# Patient Record
Sex: Male | Born: 1980 | Race: Black or African American | Hispanic: No | Marital: Married | Smoking: Current every day smoker
Health system: Southern US, Community
[De-identification: ages and names within clinical notes are randomized; demographics above are authoritative.]

---

## 2014-04-06 ENCOUNTER — Emergency Department (HOSPITAL_COMMUNITY): Payer: Self-pay

## 2014-04-06 ENCOUNTER — Encounter (HOSPITAL_COMMUNITY): Payer: Self-pay | Admitting: Emergency Medicine

## 2014-04-06 ENCOUNTER — Emergency Department (HOSPITAL_COMMUNITY)
Admission: EM | Admit: 2014-04-06 | Discharge: 2014-04-06 | Disposition: A | Discharge status: Home / Self Care | Payer: Self-pay | Attending: Emergency Medicine | Admitting: Emergency Medicine

## 2014-04-06 DIAGNOSIS — J40 Bronchitis, not specified as acute or chronic: Secondary | ICD-10-CM | POA: Insufficient documentation

## 2014-04-06 DIAGNOSIS — F17200 Nicotine dependence, unspecified, uncomplicated: Secondary | ICD-10-CM | POA: Insufficient documentation

## 2014-04-06 DIAGNOSIS — R079 Chest pain, unspecified: Secondary | ICD-10-CM | POA: Insufficient documentation

## 2014-04-06 LAB — CBC
HEMATOCRIT: 38.9 % — AB (ref 39.0–52.0)
Hemoglobin: 13.8 g/dL (ref 13.0–17.0)
MCH: 29.4 pg (ref 26.0–34.0)
MCHC: 35.5 g/dL (ref 30.0–36.0)
MCV: 82.8 fL (ref 78.0–100.0)
PLATELETS: 271 10*3/uL (ref 150–400)
RBC: 4.7 MIL/uL (ref 4.22–5.81)
RDW: 12 % (ref 11.5–15.5)
WBC: 12 10*3/uL — AB (ref 4.0–10.5)

## 2014-04-06 LAB — PRO B NATRIURETIC PEPTIDE: PRO B NATRI PEPTIDE: 9.7 pg/mL (ref 0–125)

## 2014-04-06 LAB — BASIC METABOLIC PANEL
BUN: 14 mg/dL (ref 6–23)
CHLORIDE: 103 meq/L (ref 96–112)
CO2: 25 meq/L (ref 19–32)
Calcium: 9.6 mg/dL (ref 8.4–10.5)
Creatinine, Ser: 1.27 mg/dL (ref 0.50–1.35)
GFR calc non Af Amer: 75 mL/min — ABNORMAL LOW (ref >90)
GFR, EST AFRICAN AMERICAN: 86 mL/min — AB (ref >90)
Glucose, Bld: 81 mg/dL (ref 70–99)
Potassium: 4 mEq/L (ref 3.7–5.3)
Sodium: 142 mEq/L (ref 137–147)

## 2014-04-06 LAB — I-STAT TROPONIN, ED: Troponin i, poc: 0.02 ng/mL (ref 0.00–0.08)

## 2014-04-06 MED ORDER — ALBUTEROL SULFATE (2.5 MG/3ML) 0.083% IN NEBU
5.0000 mg | INHALATION_SOLUTION | Freq: Once | RESPIRATORY_TRACT | Status: AC
  Administered 2014-04-06: 5 mg via RESPIRATORY_TRACT
  Filled 2014-04-06: qty 6

## 2014-04-06 MED ORDER — IPRATROPIUM BROMIDE 0.02 % IN SOLN
0.5000 mg | Freq: Once | RESPIRATORY_TRACT | Status: AC
  Administered 2014-04-06: 0.5 mg via RESPIRATORY_TRACT
  Filled 2014-04-06: qty 2.5

## 2014-04-06 MED ORDER — HYDROCOD POLST-CHLORPHEN POLST 10-8 MG/5ML PO LQCR
5.0000 mL | Freq: Once | ORAL | Status: AC
  Administered 2014-04-06: 5 mL via ORAL
  Filled 2014-04-06: qty 5

## 2014-04-06 MED ORDER — HYDROCOD POLST-CHLORPHEN POLST 10-8 MG/5ML PO LQCR: 5.0000 mL | Freq: Two times a day (BID) | ORAL | Status: AC | PRN

## 2014-04-06 MED ORDER — IBUPROFEN 200 MG PO TABS
400.0000 mg | ORAL_TABLET | Freq: Once | ORAL | Status: AC
  Administered 2014-04-06: 400 mg via ORAL
  Filled 2014-04-06: qty 2

## 2014-04-06 NOTE — ED Notes (Addendum)
Pt ambulatory with steady gait to void in BR.  Denies further needs/complaints at this time.  Pt reports feeling "a little" better after updraft.

## 2014-04-06 NOTE — ED Provider Notes (Signed)
CSN: [id number]     Arrival date & time 04/06/14  1641 History   First MD Initiated Contact with Patient 04/06/14 1652     Chief Complaint  Patient presents with  . Chest Pain  . Cough     (Consider location/radiation/quality/duration/timing/severity/associated sxs/prior Treatment) HPI Comments: Patient is a 33 year old otherwise healthy male who presents today with chest tightness and cough. He states his symptoms began this morning. They initially responded to an antihistamine and alka seltzer around 1130 this morning, but then later worsened. His cough is productive with mucus. He is not having any hemoptysis. He denies any lung disease or wheezing. He has had bronchitis in the past and states this feels similar to that. He does smoke approximately half a pack a day which he has been doing for 10 years. He denies prior DVT or PE, any recent travel, leg swelling, cancer. No family history of early heart disease.   Patient is a 33 y.o. male presenting with chest pain and cough. The history is provided by the patient. No language interpreter was used.  Chest Pain Associated symptoms: cough   Associated symptoms: no fever, no nausea, no palpitations and not vomiting   Cough Associated symptoms: chest pain   Associated symptoms: no chills and no fever     History reviewed. No pertinent past medical history. History reviewed. No pertinent past surgical history. No family history on file. History  Substance Use Topics  . Smoking status: Current Every Day Smoker    Types: Cigarettes  . Smokeless tobacco: Not on file  . Alcohol Use: No    Review of Systems  Constitutional: Negative for fever and chills.  Respiratory: Positive for cough and chest tightness.   Cardiovascular: Positive for chest pain. Negative for palpitations and leg swelling.  Gastrointestinal: Negative for nausea and vomiting.  All other systems reviewed and are negative.     Allergies  Review of patient's  allergies indicates no known allergies.  Home Medications   Prior to Admission medications   Not on File   BP 145/77  Pulse 80  Temp(Src) 98.5 F (36.9 C) (Oral)  Resp 18  SpO2 99% Physical Exam  Nursing note and vitals reviewed. Constitutional: He is oriented to person, place, and time. He appears well-developed and well-nourished. No distress.  HENT:  Head: Normocephalic and atraumatic.  Right Ear: External ear normal.  Left Ear: External ear normal.  Nose: Nose normal.  Eyes: Conjunctivae are normal.  Neck: Normal range of motion. No tracheal deviation present.  Cardiovascular: Normal rate, regular rhythm and normal heart sounds.   Pulmonary/Chest: Effort normal and breath sounds normal. No stridor.  Abdominal: Soft. He exhibits no distension. There is no tenderness.  Musculoskeletal: Normal range of motion.  Neurological: He is alert and oriented to person, place, and time.  Skin: Skin is warm and dry. He is not diaphoretic.  Psychiatric: He has a normal mood and affect. His behavior is normal.    ED Course  Procedures (including critical care time) Labs Review Labs Reviewed  CBC - Abnormal; Notable for the following:    WBC 12.0 (*)    HCT 38.9 (*)    All other components within normal limits  BASIC METABOLIC PANEL - Abnormal; Notable for the following:    GFR calc non Af Amer 75 (*)    GFR calc Af Amer 86 (*)    All other components within normal limits  PRO B NATRIURETIC PEPTIDE  I-STAT TROPOININ, ED  Imaging Review Dg Chest 2 View  04/06/2014   CLINICAL DATA:  Chest pain and shortness of breath.  EXAM: CHEST  2 VIEW  COMPARISON:  None.  FINDINGS: The cardiac silhouette, mediastinal and hilar contours are normal. The lungs are clear. No pleural effusion. The bony thorax is intact.  IMPRESSION: Normal chest x-ray.   Electronically Signed   By: Loralie ChampagneMark  Gallerani M.D.   On: 04/06/2014 17:34     EKG Interpretation None      MDM   Final diagnoses:   Bronchitis   Pt CXR negative for acute infiltrate. Patients symptoms are consistent with URI, likely viral etiology. Discussed that antibiotics are not indicated for viral infections. Pt will be discharged with symptomatic treatment. Patient is both Well's and PERC negative. A d dimer is not indicated.  Verbalizes understanding and is agreeable with plan. Pt is hemodynamically stable & in NAD prior to dc. Discussed case with Dr. Fayrene FearingJames who agrees with plan. Patient / Family / Caregiver informed of clinical course, understand medical decision-making process, and agree with plan.     Mora BellmanHannah S Merrell, PA-C 04/07/14 1526

## 2014-04-06 NOTE — Discharge Instructions (Signed)
Bronchitis Bronchitis is inflammation of the airways that extend from the windpipe into the lungs (bronchi). The inflammation often causes mucus to develop, which leads to a cough. If the inflammation becomes severe, it may cause shortness of breath. CAUSES  Bronchitis may be caused by:   Viral infections.   Bacteria.   Cigarette smoke.   Allergens, pollutants, and other irritants.  SIGNS AND SYMPTOMS  The most common symptom of bronchitis is a frequent cough that produces mucus. Other symptoms include:  Fever.   Body aches.   Chest congestion.   Chills.   Shortness of breath.   Sore throat.  DIAGNOSIS  Bronchitis is usually diagnosed through a medical history and physical exam. Tests, such as chest X-rays, are sometimes done to rule out other conditions.  TREATMENT  You may need to avoid contact with whatever caused the problem (smoking, for example). Medicines are sometimes needed. These may include:  Antibiotics. These may be prescribed if the condition is caused by bacteria.  Cough suppressants. These may be prescribed for relief of cough symptoms.   Inhaled medicines. These may be prescribed to help open your airways and make it easier for you to breathe.   Steroid medicines. These may be prescribed for those with recurrent (chronic) bronchitis. HOME CARE INSTRUCTIONS  Get plenty of rest.   Drink enough fluids to keep your urine clear or pale yellow (unless you have a medical condition that requires fluid restriction). Increasing fluids may help thin your secretions and will prevent dehydration.   Only take over-the-counter or prescription medicines as directed by your health care provider.  Only take antibiotics as directed. Make sure you finish them even if you start to feel better.  Avoid secondhand smoke, irritating chemicals, and strong fumes. These will make bronchitis worse. If you are a smoker, quit smoking. Consider using nicotine gum or  skin patches to help control withdrawal symptoms. Quitting smoking will help your lungs heal faster.   Put a cool-mist humidifier in your bedroom at night to moisten the air. This may help loosen mucus. Change the water in the humidifier daily. You can also run the hot water in your shower and sit in the bathroom with the door closed for 5 10 minutes.   Follow up with your health care provider as directed.   Wash your hands frequently to avoid catching bronchitis again or spreading an infection to others.  SEEK MEDICAL CARE IF: Your symptoms do not improve after 1 week of treatment.  SEEK IMMEDIATE MEDICAL CARE IF:  Your fever increases.  You have chills.   You have chest pain.   You have worsening shortness of breath.   You have bloody sputum.  You faint.  You have lightheadedness.  You have a severe headache.   You vomit repeatedly. MAKE SURE YOU:   Understand these instructions.  Will watch your condition.  Will get help right away if you are not doing well or get worse. Document Released: 10/08/2005 Document Revised: 07/29/2013 Document Reviewed: 06/02/2013 [facility] Patient Information 2014 [place], [place].   Emergency Department Resource Guide 1) Find a Doctor and Pay Out of Pocket Although you won't have to find out who is covered by your insurance plan, it is a good idea to ask around and get recommendations. You will then need to call the office and see if the doctor you have chosen will accept you as a new patient and what types of options they offer for patients who are self-pay. Some doctors  offer discounts or will set up payment plans for their patients who do not have insurance, but you will need to ask so you aren't surprised when you get to your appointment.  2) Contact Your Local Health Department Not all health departments have doctors that can see patients for sick visits, but many do, so it is worth a call to see if yours does. If you don't  know where your local health department is, you can check in your phone book. The CDC also has a tool to help you locate your state's health department, and many state websites also have listings of all of their local health departments.  3) Find a Walk-in Clinic If your illness is not likely to be very severe or complicated, you may want to try a walk in clinic. These are popping up all over the country in pharmacies, drugstores, and shopping centers. They're usually staffed by nurse practitioners or physician assistants that have been trained to treat common illnesses and complaints. They're usually fairly quick and inexpensive. However, if you have serious medical issues or chronic medical problems, these are probably not your best option.  No Primary Care Doctor: - Call Health Connect at  [phone] - they can help you locate a primary care doctor that  accepts your insurance, provides certain services, etc. - Physician Referral Service- [phone]  Chronic Pain Problems: Organization         Address  Phone   Notes  [facility]  [phone] Patients need to be referred by their primary care doctor.   Medication Assistance: Organization         Address  Phone   Notes  [facility] Medication [facility] [address] [place], [place] [address] [phone]) [phone] --Must be a resident of [place] [facility] -- Must have NO insurance coverage whatsoever (no Medicaid/ Medicare, etc.) -- The pt. MUST have a primary care doctor that directs their care regularly and follows them in the community   MedAssist  [phone]   [employer]  [phone]) [phone]    Agencies that provide inexpensive medical care: Organization         Address  Phone   Notes  [facility]  [phone]   [facility]    [phone]) [phone]   [facility] [address] [place], [place] [address] [phone]) [phone]   [facility]  [place] [address], [place] [phone]   [facility]anned Parenthood    [phone](336) [phone]   [facility]    [phone]   [facility]  [address], [place] Phone:  [phone], Fax:  [phone] Hours of Operation:  9 am - 6 pm, M-F.  Also accepts Medicaid/Medicare and self-pay.  [facility]  [address], [place] Phone: [phone], Fax: [phone]. Hours of Operation:  8:30 am - 5:30 pm, M-F.  Also accepts Medicaid and self-pay.  [facility] [place] [address], [place] Phone: [phone](336) [phone]   [facility] [address], [place], [place] [phone], Ext. [phone] Mondays & Thursdays: 7-9 AM.  First 15 patients are seen on a first come, first serve basis.    Medicaid-accepting [facility] Providers:  Organization         Address  Phone   Notes  [facility] [address], [address], [place] [phone]) [phone] Also accepts self-pay patients.  [facility] [address], [place]  [phone]   [facility] [address], [place] [phone]   [facility] [address] [place] [address], [place] [phone]) [phone]   Renaye RakersVeita Bland [address], [place]   [phone] Only accepts [place] Access [place]icaid patients after they have their name applied to their card.   Self-Pay (no insurance) in [facility] [place]:  Organization         Address  Phone   Notes  Sickle Cell Patients, [facility] [address], [place] [phone]) [phone]   [facility] [address], [place] [phone]   [facility] [place]  [address], [place] [phone]   [facility]/Dr. Osei-Bonsu  [address] [place] [address], [place] or [phone], [place] [phone] Phone  number for both [place] and [place] locations is the same.  [facility] and [facility] [address], [place] [phone]   [facility] [place] [address] [place] [address], [place] or [address] [phone](336) [phone]   [facility] [address], [place] [phone], phone; [phone]) [phone], fax Sees patients 1st and 3rd Saturday of every month.  Must not qualify for public or private insurance (i.e. Medicaid, Medicare, [place] Health Choice, Veterans' Benefits)  Household income should be no more than 200% of the poverty level The clinic cannot treat you if you are pregnant or think you are pregnant  Sexually transmitted diseases are not treated at the clinic.    Dental Care: Organization         Address  Phone  Notes  [facility] Department of [facility] [address], [place] [phone] Accepts children up to age 31 who are enrolled in [place]icaid or [place] Health Choice; pregnant women with a Medicaid card; and children who have applied for Medicaid or [place] Health Choice, but were declined, whose parents can pay a reduced fee at time of service.  [facility] Department of [facility] [place]  [address], [place] [phone] Accepts children up to age 31 who are enrolled in [place]icaid or [place] Health Choice; pregnant women with a Medicaid card; and children who have applied for Medicaid or [place] Health Choice, but were declined, whose parents can pay a reduced fee at time of service.  [facility]  [address], [place] [phone] Patients are seen by appointment only. Walk-ins are not accepted. [facility] will see patients 31 years of age and older. Monday - Tuesday (8am-5pm) Most Wednesdays (8:30-5pm) $30 per visit, cash only  [facility]  [address], [facility] [phone] Patients are seen by appointment only.  Walk-ins are not accepted. [facility] will see patients 31 years of age and older. One Wednesday Evening (Monthly: Volunteer Based).  $30 per visit, cash only  [employer] [facility]  [phone] for adults; Children under age 374, call Graduate Pediatric Dentistry at [phone]. Children aged 354-14, please call [phone] to request a pediatric application.  Dental services are provided in all areas of dental care including fillings, crowns and bridges, complete and partial dentures, implants, gum treatment, root canals, and extractions. Preventive care  is also provided. Treatment is provided to both adults and children. Patients are selected via a lottery and there is often a waiting list.   [facility] [address], [place]  [phone]) [phone] [email or web address]   [facility] [address], [place], [place] [phone], Ext. [phone] Second and Fourth Thursday of each month, opens at 6:30 AM; Clinic ends at 9 AM.  Patients are seen on a first-come first-served basis, and a limited number are seen during each clinic.   [facility]  [address], [place], [place] [phone]   Eligibility Requirements You must have lived in [place], [place], or [place] counties for at least the last three months.   You cannot be eligible for state or federal sponsored National Cityhealthcare insurance, including CIGNAVeterans Administration, [place]icaid, or [employer] EntertainmentMedicare.   You generally cannot be eligible for healthcare insurance through your employer.    How to apply: Eligibility screenings are held every Tuesday and Wednesday afternoon from 1:00 pm until 4:00 pm. You do not need an appointment for the interview!  [facility] [address], [place], [place] [phone]   [facility]  [phone]   [facility]  [phone]   [facility]  [phone]    Behavioral Health  Resources in the Community: Intensive Outpatient Programs Organization         Address  Phone  Notes  [facility] [address], [place], [place] [phone]   [facility] [address], [place], [place] [phone]   [facility] [address], [place], [place]  [phone]   [facility] [address],  [place], [place] [phone] or [phone]   Substance Abuse Resources Organization         Address  Phone  Notes  [facility] Drug Services  [phone]   [facility]  [phone]   [facility]  [phone]   [facility]  [phone]   Residential & Outpatient Substance Abuse Program  [phone]   Psychological Services Organization         Address  Phone  Notes  [facility]  [phone]- [phone]   [facility]  [phone]   [facility] [address], [place] [phone] or [phone]    Mobile Crisis Teams Organization         Address  Phone  Notes  [facility], Mobile Crisis Care Unit  [phone]   [facility]  [address] [place], [place] [phone]   Doristine LocksSharon DeEsch [address] [place] [phone]    Self-Help/Support Groups Organization         Address  Phone             Notes  [facility]ental Health Assoc. of [place] - variety of support groups  336- [phone] Call for more information  Narcotics Anonymous (NA), [facility]aring [facility] [address], [place]  2 meetings at this location   Statisticianesidential Treatment Programs Organization         Address  Phone  Notes  [facility] [address],    [place]  [phone]   [facility]  [address], [place] [phone], [place], [place] [phone]   [facility] [address], [place] [phone] Admissions: 8am-3pm M-F  Incentives Substance Abuse  Treatment Center [address]  [place], [place]   [facility] [address] Jadene Pierini [place], [place]   The Gaines.,  [place], [place]   [facility] - Intensive Outpatient [address], [place], [place]   [facility] [address]  [place], [place] [phone] or [phone]   [facility] (RTS) [address] [place], [place] Accepts Medicaid  [facility] [place] [address],  [place] [phone] Substance Abuse/Addiction Treatment   [facility] Organization         Address  Phone  Notes  [facility]  [phone]   Domenic Schwab, PhD [address] Arlis Porta [place], [place]   [phone] or [phone]   [facility] [place], [place] [phone]   [facility] [address], [place], [place] [phone] Insurance/Medicaid/sponsorship through [facility] and Families [address] [place] [address]                                    [place], [place] [phone] [place] [address], [place] [phone]    Dr. Adele Schilder  [phone]   [facility] [place] Dept. 1) [address], [place] 2) [place] 3)  [place] [address], [place] [phone]  [phone]   [place] [phone] or [phone] (After Hours)

## 2014-04-06 NOTE — ED Notes (Signed)
Pt states that he woke up coughing this morning around 530 then when back to sleep. Pt states that when he got up at 830 he had chest tightness.  Pt states he took Claritin and alka seltzer and states the pain felt better. Pt states that pain is tight when he breathes/coughs.

## 2014-04-06 NOTE — ED Notes (Signed)
Initial Contact - [facility]/o cough and central CP.  Reports pain worse with cough and breathing.  Pt reports awakening with these symp this AM.  Pt with faint exp wheeze noted.  Denies SOB.  Speaking full/clear sentences, rr even/un-lab.  Skin PWD.  MAEI, ambulatory with steady gait.  Changed to hospital gown, placed to cardiac/02 monitor.  NAD.

## 2014-04-10 NOTE — ED Provider Notes (Signed)
Medical screening examination/treatment/procedure(s) were performed by non-physician practitioner and as supervising physician I was immediately available for consultation/collaboration.   EKG Interpretation   Date/Time:  Tuesday April 06 2014 16:46:39 EDT Ventricular Rate:  80 PR Interval:  155 QRS Duration: 98 QT Interval:  347 QTC Calculation: 400 R Axis:   68 Text Interpretation:  Sinus rhythm RSR' in V1 or V2, probably normal  variant ED PHYSICIAN INTERPRETATION AVAILABLE IN [facility] Confirmed  by TEST, Record ([id number]) on 04/08/2014 12:32:11 PM        Rolland PorterMark Destani Wamser, MD 04/10/14 0730

## 2014-11-11 ENCOUNTER — Emergency Department (HOSPITAL_COMMUNITY)
Admission: EM | Admit: 2014-11-11 | Discharge: 2014-11-12 | Disposition: A | Discharge status: Home / Self Care | Payer: Self-pay | Attending: Emergency Medicine | Admitting: Emergency Medicine

## 2014-11-11 ENCOUNTER — Encounter (HOSPITAL_COMMUNITY): Payer: Self-pay

## 2014-11-11 DIAGNOSIS — W540XXA Bitten by dog, initial encounter: Secondary | ICD-10-CM | POA: Insufficient documentation

## 2014-11-11 DIAGNOSIS — S51812A Laceration without foreign body of left forearm, initial encounter: Secondary | ICD-10-CM | POA: Insufficient documentation

## 2014-11-11 DIAGNOSIS — S51832A Puncture wound without foreign body of left forearm, initial encounter: Secondary | ICD-10-CM | POA: Insufficient documentation

## 2014-11-11 DIAGNOSIS — Y9389 Activity, other specified: Secondary | ICD-10-CM | POA: Insufficient documentation

## 2014-11-11 DIAGNOSIS — Z720 Tobacco use: Secondary | ICD-10-CM | POA: Insufficient documentation

## 2014-11-11 DIAGNOSIS — Y9289 Other specified places as the place of occurrence of the external cause: Secondary | ICD-10-CM | POA: Insufficient documentation

## 2014-11-11 DIAGNOSIS — Z79899 Other long term (current) drug therapy: Secondary | ICD-10-CM | POA: Insufficient documentation

## 2014-11-11 DIAGNOSIS — Z792 Long term (current) use of antibiotics: Secondary | ICD-10-CM | POA: Insufficient documentation

## 2014-11-11 DIAGNOSIS — Z23 Encounter for immunization: Secondary | ICD-10-CM | POA: Insufficient documentation

## 2014-11-11 DIAGNOSIS — Y998 Other external cause status: Secondary | ICD-10-CM | POA: Insufficient documentation

## 2014-11-11 MED ORDER — AMOXICILLIN-POT CLAVULANATE 875-125 MG PO TABS: 1.0000 | ORAL_TABLET | Freq: Two times a day (BID) | ORAL | Status: AC

## 2014-11-11 MED ORDER — BACITRACIN ZINC 500 UNIT/GM EX OINT
TOPICAL_OINTMENT | CUTANEOUS | Status: AC
  Filled 2014-11-11: qty 0.9

## 2014-11-11 MED ORDER — HYDROCODONE-ACETAMINOPHEN 5-325 MG PO TABS: 1.0000 | ORAL_TABLET | ORAL | Status: AC | PRN

## 2014-11-11 MED ORDER — CLINDAMYCIN HCL 150 MG PO CAPS: 300.0000 mg | ORAL_CAPSULE | Freq: Three times a day (TID) | ORAL | Status: AC

## 2014-11-11 MED ORDER — TETANUS-DIPHTH-ACELL PERTUSSIS 5-2.5-18.5 LF-MCG/0.5 IM SUSP
0.5000 mL | Freq: Once | INTRAMUSCULAR | Status: AC
  Administered 2014-11-11: 0.5 mL via INTRAMUSCULAR
  Filled 2014-11-11: qty 0.5

## 2014-11-11 MED ORDER — HYDROCODONE-ACETAMINOPHEN 5-325 MG PO TABS
1.0000 | ORAL_TABLET | Freq: Once | ORAL | Status: AC
  Administered 2014-11-11: 1 via ORAL
  Filled 2014-11-11: qty 1

## 2014-11-11 MED ORDER — AMOXICILLIN-POT CLAVULANATE 875-125 MG PO TABS
1.0000 | ORAL_TABLET | Freq: Once | ORAL | Status: AC
  Administered 2014-11-11: 1 via ORAL
  Filled 2014-11-11: qty 1

## 2014-11-11 NOTE — ED Notes (Signed)
Pt was playing with his dog and the dog bit him on the left forearm, pt has bite marks and skin is broken

## 2014-11-11 NOTE — ED Notes (Signed)
Pt's tetanus vaccination is outdated

## 2014-11-11 NOTE — Discharge Instructions (Signed)
Animal Bite °Animal bite wounds can get infected. It is important to get proper medical treatment. Ask your doctor if you need a rabies shot. °HOME CARE  °· Follow your doctor's instructions for taking care of your wound. °· Only take medicine as told by your doctor. °· Take your medicine (antibiotics) as told. Finish them even if you start to feel better. °· Keep all doctor visits as told. °You may need a tetanus shot if:  °· You cannot remember when you had your last tetanus shot. °· You have never had a tetanus shot. °· The injury broke your skin. °If you need a tetanus shot and you choose not to have one, you may get tetanus. Sickness from tetanus can be serious. °GET HELP RIGHT AWAY IF:  °· Your wound is warm, red, sore, or puffy (swollen). °· You notice yellowish-white fluid (pus) or a bad smell coming from the wound. °· You see a red line on the skin coming from the wound. °· You have a fever, chills, or you feel sick. °· You feel sick to your stomach (nauseous), or you throw up (vomit). °· Your pain does not go away, or it gets worse. °· You have trouble moving the injured part. °· You have questions or concerns. °MAKE SURE YOU:  °· Understand these instructions. °· Will watch your condition. °· Will get help right away if you are not doing well or get worse. °Document Released: 10/08/2005 Document Revised: 12/31/2011 Document Reviewed: 05/30/2011 °Exit[employer]® Patient Information ©2015 [employer]. This information is not intended to replace advice given to you by your health care provider. Make sure you discuss any questions you have with your health care provider. ° °Wound Care °Wound care helps prevent pain and infection.  °You may need a tetanus shot if: °· You cannot remember when you had your last tetanus shot. °· You have never had a tetanus shot. °· The injury broke your skin. °If you need a tetanus shot and you choose not to have one, you may get tetanus. Sickness from tetanus can be serious. °HOME  CARE  °· Only take medicine as told by your doctor. °· Clean the wound daily with mild soap and water. °· Change any bandages (dressings) as told by your doctor. °· Put medicated cream and a bandage on the wound as told by your doctor. °· Change the bandage if it gets wet, dirty, or starts to smell. °· Take showers. Do not take baths, swim, or do anything that puts your wound under water. °· Rest and raise (elevate) the wound until the pain and puffiness (swelling) are better. °· Keep all doctor visits as told. °GET HELP RIGHT AWAY IF:  °· Yellowish-white fluid (pus) comes from the wound. °· Medicine does not lessen your pain. °· There is a red streak going away from the wound. °· You have a fever. °MAKE SURE YOU:  °· Understand these instructions. °· Will watch your condition. °· Will get help right away if you are not doing well or get worse. °Document Released: 07/17/2008 Document Revised: 12/31/2011 Document Reviewed: 02/11/2011 °ExitCare® Patient Information ©2015 [employer]. This information is not intended to replace advice given to you by your health care provider. Make sure you discuss any questions you have with your health care provider. ° °

## 2014-11-24 NOTE — ED Provider Notes (Signed)
CSN: [id number]     Arrival date & time 11/11/14  2226 History   First MD Initiated Contact with Patient 11/11/14 2243     Chief Complaint  Patient presents with  . Animal Bite     (Consider location/radiation/quality/duration/timing/severity/associated sxs/prior Treatment) Patient is a 34 y.o. male presenting with animal bite. The history is provided by the patient. No language interpreter was used.  Animal Bite Contact animal:  Dog Location:  Shoulder/arm Shoulder/arm injury location:  L forearm and L hand Provoked: Patient bitten by his own dog while "rough playing" with the animal.    Animal's rabies vaccination status:  Up to date Animal in possession: yes   Tetanus status:  Out of date Associated symptoms: no fever and no numbness   Associated symptoms comment:  Multiple puncture wounds and a 2 cm laceration to forearm, left, with deep abrasions to dorsal aspect left hand.    History reviewed. No pertinent past medical history. History reviewed. No pertinent past surgical history. History reviewed. No pertinent family history. History  Substance Use Topics  . Smoking status: Current Every Day Smoker    Types: Cigarettes  . Smokeless tobacco: Not on file  . Alcohol Use: No    Review of Systems  Constitutional: Negative for fever.  Gastrointestinal: Negative for nausea.  Musculoskeletal: Negative for myalgias.  Skin: Positive for wound.  Neurological: Negative for weakness and numbness.      Allergies  Review of patient's allergies indicates no known allergies.  Home Medications   Prior to Admission medications   Medication Sig Start Date End Date Taking? Authorizing Provider  amoxicillin-clavulanate (AUGMENTIN) 875-125 MG per tablet Take 1 tablet by mouth every 12 (twelve) hours. 11/11/14   Zohar Maroney A Braxten Memmer, PA-C  aspirin-sod bicarb-citric acid (ALKA-SELTZER) 325 MG TBEF tablet Take 325 mg by mouth every 6 (six) hours as needed (UPSET STOMACH).    Historical  Provider, MD  chlorpheniramine-HYDROcodone (TUSSIONEX PENNKINETIC ER) 10-8 MG/5ML LQCR Take 5 mLs by mouth every 12 (twelve) hours as needed for cough (Cough). 04/06/14   Mora Bellman, PA-C  clindamycin (CLEOCIN) 150 MG capsule Take 2 capsules (300 mg total) by mouth 3 (three) times daily. 11/11/14   Aeralyn Barna A Scot Shiraishi, PA-C  HYDROcodone-acetaminophen (NORCO/VICODIN) 5-325 MG per tablet Take 1-2 tablets by mouth every 4 (four) hours as needed. 11/11/14   Syvilla Martin A Lamonta Cypress, PA-C  ibuprofen (ADVIL,MOTRIN) 200 MG tablet Take 800 mg by mouth every 6 (six) hours as needed (PAIN).    Historical Provider, MD  loratadine (CLARITIN) 10 MG tablet Take 10 mg by mouth daily.    Historical Provider, MD   BP 136/76 mmHg  Pulse 83  Temp(Src) 98.3 F (36.8 C) (Oral)  Resp 18  Ht  (1.778 m)  Wt 205 lb (92.987 kg)  BMI 29.41 kg/m2  SpO2 99% Physical Exam  Constitutional: He appears well-developed and well-nourished. No distress.  Musculoskeletal:  Multiple puncture wounds left dorsal and volar forearm. 2 cm laceration dorsal mid-shaft forearm. FROM all joints. No bony deformities of the extremity. Hand has deep linear abrasions to dorsum, none are full thickness wounds.    ED Course  Procedures (including critical care time) Labs Review Labs Reviewed - No data to display  Imaging Review No results found.   EKG Interpretation None      MDM   Final diagnoses:  Dog bite    Patient's wounds cleaned, appropriately bandaged. Discussed the reasoning for not repairing laceration - patient aware and in agreement.  Abx started, pain control provided. Recommended 2 day recheck.     Arnoldo HookerShari A Allyanna Appleman, PA-C 11/24/14 2257  Candyce ChurnJohn David Wofford III, MD 11/25/14 [phone]

## 2015-07-10 ENCOUNTER — Emergency Department (HOSPITAL_COMMUNITY): Payer: No Typology Code available for payment source

## 2015-07-10 ENCOUNTER — Emergency Department (HOSPITAL_COMMUNITY)
Admission: EM | Admit: 2015-07-10 | Discharge: 2015-07-10 | Disposition: A | Discharge status: Home / Self Care | Payer: No Typology Code available for payment source | Attending: Emergency Medicine | Admitting: Emergency Medicine

## 2015-07-10 DIAGNOSIS — Z79899 Other long term (current) drug therapy: Secondary | ICD-10-CM | POA: Diagnosis not present

## 2015-07-10 DIAGNOSIS — Y998 Other external cause status: Secondary | ICD-10-CM | POA: Diagnosis not present

## 2015-07-10 DIAGNOSIS — Y9389 Activity, other specified: Secondary | ICD-10-CM | POA: Diagnosis not present

## 2015-07-10 DIAGNOSIS — M542 Cervicalgia: Secondary | ICD-10-CM

## 2015-07-10 DIAGNOSIS — S199XXA Unspecified injury of neck, initial encounter: Secondary | ICD-10-CM | POA: Diagnosis present

## 2015-07-10 DIAGNOSIS — Y92410 Unspecified street and highway as the place of occurrence of the external cause: Secondary | ICD-10-CM | POA: Insufficient documentation

## 2015-07-10 DIAGNOSIS — S301XXA Contusion of abdominal wall, initial encounter: Secondary | ICD-10-CM | POA: Diagnosis not present

## 2015-07-10 DIAGNOSIS — Z720 Tobacco use: Secondary | ICD-10-CM | POA: Diagnosis not present

## 2015-07-10 DIAGNOSIS — S161XXA Strain of muscle, fascia and tendon at neck level, initial encounter: Secondary | ICD-10-CM | POA: Diagnosis not present

## 2015-07-10 DIAGNOSIS — S3992XA Unspecified injury of lower back, initial encounter: Secondary | ICD-10-CM | POA: Insufficient documentation

## 2015-07-10 LAB — CBC WITH DIFFERENTIAL/PLATELET
Basophils Absolute: 0.1 10*3/uL (ref 0.0–0.1)
Basophils Relative: 1 %
Eosinophils Absolute: 0.3 10*3/uL (ref 0.0–0.7)
Eosinophils Relative: 3 %
HEMATOCRIT: 39.7 % (ref 39.0–52.0)
Hemoglobin: 13.9 g/dL (ref 13.0–17.0)
Lymphocytes Relative: 41 %
Lymphs Abs: 3.7 10*3/uL (ref 0.7–4.0)
MCH: 29.8 pg (ref 26.0–34.0)
MCHC: 35 g/dL (ref 30.0–36.0)
MCV: 85.2 fL (ref 78.0–100.0)
MONO ABS: 0.4 10*3/uL (ref 0.1–1.0)
MONOS PCT: 4 %
NEUTROS ABS: 4.7 10*3/uL (ref 1.7–7.7)
Neutrophils Relative %: 51 %
Platelets: 322 10*3/uL (ref 150–400)
RBC: 4.66 MIL/uL (ref 4.22–5.81)
RDW: 12.1 % (ref 11.5–15.5)
WBC: 9.2 10*3/uL (ref 4.0–10.5)

## 2015-07-10 LAB — BASIC METABOLIC PANEL
Anion gap: 10 (ref 5–15)
BUN: 8 mg/dL (ref 6–20)
CO2: 25 mmol/L (ref 22–32)
Calcium: 9.1 mg/dL (ref 8.9–10.3)
Chloride: 107 mmol/L (ref 101–111)
Creatinine, Ser: 1 mg/dL (ref 0.61–1.24)
GFR calc Af Amer: >60 mL/min (ref >60)
GFR calc non Af Amer: >60 mL/min (ref >60)
GLUCOSE: 93 mg/dL (ref 65–99)
Potassium: 3.3 mmol/L — ABNORMAL LOW (ref 3.5–5.1)
Sodium: 142 mmol/L (ref 135–145)

## 2015-07-10 MED ORDER — IOHEXOL 300 MG/ML  SOLN
100.0000 mL | Freq: Once | INTRAMUSCULAR | Status: AC | PRN
  Administered 2015-07-10: 100 mL via INTRAVENOUS

## 2015-07-10 MED ORDER — KETOROLAC TROMETHAMINE 30 MG/ML IJ SOLN
30.0000 mg | Freq: Once | INTRAMUSCULAR | Status: AC
  Administered 2015-07-10: 30 mg via INTRAVENOUS
  Filled 2015-07-10: qty 1

## 2015-07-10 MED ORDER — SODIUM CHLORIDE 0.9 % IV BOLUS (SEPSIS)
1000.0000 mL | Freq: Once | INTRAVENOUS | Status: AC
  Administered 2015-07-10: 1000 mL via INTRAVENOUS
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
## 2015-07-10 NOTE — ED Notes (Signed)
Pt was resting , states he must be feeling better because he has been sleeping

## 2015-07-10 NOTE — ED Notes (Signed)
Pt has continued to sleep throughout his stay
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
## 2015-07-10 NOTE — ED Notes (Signed)
Patient transported to CT 
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
## 2015-07-10 NOTE — ED Notes (Signed)
Reviewed d/c instructions with pt, obtained last set of v/s, and answered questions. Pt voiced understanding and departed in a wheelchair with help from family.
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
